# Patient Record
Sex: Female | Born: 1975 | Race: Black or African American | Hispanic: No | Marital: Married | Smoking: Never smoker
Health system: Southern US, Community
[De-identification: ages and names within clinical notes are randomized; demographics above are authoritative.]

## PROBLEM LIST (undated history)

## (undated) DIAGNOSIS — IMO0002 Reserved for concepts with insufficient information to code with codable children: Secondary | ICD-10-CM

## (undated) DIAGNOSIS — R87619 Unspecified abnormal cytological findings in specimens from cervix uteri: Secondary | ICD-10-CM

## (undated) HISTORY — PX: COLPOSCOPY: SHX161

## (undated) HISTORY — DX: Reserved for concepts with insufficient information to code with codable children: IMO0002

## (undated) HISTORY — DX: Unspecified abnormal cytological findings in specimens from cervix uteri: R87.619

---

## 2008-01-25 ENCOUNTER — Inpatient Hospital Stay (HOSPITAL_COMMUNITY): Admission: AD | Admit: 2008-01-25 | Discharge: 2008-01-25 | Payer: Self-pay | Admitting: Obstetrics and Gynecology

## 2008-05-27 ENCOUNTER — Inpatient Hospital Stay (HOSPITAL_COMMUNITY): Admission: AD | Admit: 2008-05-27 | Discharge: 2008-05-30 | Payer: Self-pay | Admitting: Obstetrics and Gynecology

## 2008-06-03 ENCOUNTER — Ambulatory Visit: Admission: RE | Admit: 2008-06-03 | Discharge: 2008-06-03 | Payer: Self-pay | Admitting: Obstetrics and Gynecology
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
## 2011-09-06 LAB — URINALYSIS, ROUTINE W REFLEX MICROSCOPIC
Bilirubin Urine: NEGATIVE
Ketones, ur: NEGATIVE
Nitrite: NEGATIVE
Protein, ur: 30 — AB
Urobilinogen, UA: 1

## 2011-09-06 LAB — URINE CULTURE: Colony Count: 1000

## 2011-09-12 LAB — CBC
HCT: 31.9 — ABNORMAL LOW
MCHC: 34.9
MCHC: 35.3
MCV: 94.8
Platelets: 170
Platelets: 188
RBC: 2.61 — ABNORMAL LOW
RDW: 12.5
RDW: 12.8
WBC: 17.1 — ABNORMAL HIGH
WBC: 9.3

## 2011-09-12 LAB — COMPREHENSIVE METABOLIC PANEL
ALT: 14
ALT: 15
AST: 24
AST: 27
Albumin: 1.9 — ABNORMAL LOW
CO2: 22
Calcium: 8.3 — ABNORMAL LOW
Chloride: 105
Creatinine, Ser: 0.68
GFR calc Af Amer: >60
GFR calc Af Amer: >60
GFR calc non Af Amer: >60
Glucose, Bld: 98
Sodium: 134 — ABNORMAL LOW
Sodium: 135
Total Bilirubin: 0.7
Total Protein: 5 — ABNORMAL LOW

## 2011-09-12 LAB — CREATININE CLEARANCE, URINE, 24 HOUR
Creatinine Clearance: 113
Creatinine, 24H Ur: 1102
Creatinine, Urine: 21.4
Creatinine: 0.68

## 2011-09-12 LAB — URINALYSIS, ROUTINE W REFLEX MICROSCOPIC
Hgb urine dipstick: NEGATIVE
Leukocytes, UA: NEGATIVE
Specific Gravity, Urine: 1.02
Urobilinogen, UA: 0.2

## 2011-09-12 LAB — PROTEIN, URINE, 24 HOUR: Urine Total Volume-UPROT: 5150

## 2011-09-12 LAB — URINE MICROSCOPIC-ADD ON

## 2011-09-12 LAB — LACTATE DEHYDROGENASE: LDH: 243

## 2011-09-12 LAB — URIC ACID: Uric Acid, Serum: 5.3

## 2011-12-17 NOTE — L&D Delivery Note (Signed)
Delivery Note At 1:33 AM a viable female was delivered via Vaginal, Spontaneous Delivery (Presentation: occiput anterior ;  ).  APGAR: 8, 9 ; weight .   Placenta status:intact 3VC , .  Cord:  with the following complications:None  .  Cord pH: NA  Anesthesia: Epidural  Episiotomy: none   Lacerations:none   Suture Repair: NA Est. Blood Loss (mL): 150 ml   Mom to postpartum.  Baby to nursery-stable.  Elsey Holts J. 08/01/2012, 1:48 AM

## 2011-12-25 LAB — OB RESULTS CONSOLE ABO/RH: RH Type: POSITIVE

## 2011-12-25 LAB — OB RESULTS CONSOLE HEPATITIS B SURFACE ANTIGEN: Hepatitis B Surface Ag: NEGATIVE

## 2011-12-25 LAB — OB RESULTS CONSOLE HIV ANTIBODY (ROUTINE TESTING): HIV: NONREACTIVE

## 2012-01-15 ENCOUNTER — Other Ambulatory Visit: Payer: Self-pay | Admitting: Obstetrics and Gynecology

## 2012-01-15 ENCOUNTER — Other Ambulatory Visit (HOSPITAL_COMMUNITY)
Admission: RE | Admit: 2012-01-15 | Discharge: 2012-01-15 | Disposition: A | Discharge status: Home / Self Care | Payer: PRIVATE HEALTH INSURANCE | Source: Ambulatory Visit | Attending: Obstetrics and Gynecology | Admitting: Obstetrics and Gynecology

## 2012-01-15 DIAGNOSIS — Z01419 Encounter for gynecological examination (general) (routine) without abnormal findings: Secondary | ICD-10-CM | POA: Insufficient documentation

## 2012-01-15 DIAGNOSIS — Z113 Encounter for screening for infections with a predominantly sexual mode of transmission: Secondary | ICD-10-CM | POA: Insufficient documentation

## 2012-01-15 LAB — OB RESULTS CONSOLE GC/CHLAMYDIA
Chlamydia: NEGATIVE
Gonorrhea: NEGATIVE

## 2012-07-09 LAB — OB RESULTS CONSOLE GBS: GBS: POSITIVE

## 2012-07-24 ENCOUNTER — Encounter (HOSPITAL_COMMUNITY): Payer: Self-pay | Admitting: *Deleted

## 2012-07-24 ENCOUNTER — Telehealth (HOSPITAL_COMMUNITY): Payer: Self-pay | Admitting: *Deleted

## 2012-07-24 NOTE — Telephone Encounter (Signed)
Preadmission screen  

## 2012-07-30 ENCOUNTER — Other Ambulatory Visit: Payer: Self-pay | Admitting: Obstetrics and Gynecology

## 2012-07-31 ENCOUNTER — Encounter (HOSPITAL_COMMUNITY): Payer: Self-pay

## 2012-07-31 ENCOUNTER — Encounter (HOSPITAL_COMMUNITY): Payer: Self-pay | Admitting: Anesthesiology

## 2012-07-31 ENCOUNTER — Inpatient Hospital Stay (HOSPITAL_COMMUNITY)
Admission: RE | Admit: 2012-07-31 | Discharge: 2012-08-03 | DRG: 775 | Disposition: A | Discharge status: Home / Self Care | Payer: Commercial Managed Care - PPO | Source: Ambulatory Visit | Attending: Obstetrics and Gynecology | Admitting: Obstetrics and Gynecology

## 2012-07-31 ENCOUNTER — Inpatient Hospital Stay (HOSPITAL_COMMUNITY): Payer: Commercial Managed Care - PPO | Admitting: Anesthesiology

## 2012-07-31 DIAGNOSIS — O99892 Other specified diseases and conditions complicating childbirth: Secondary | ICD-10-CM | POA: Diagnosis present

## 2012-07-31 DIAGNOSIS — Z22330 Carrier of Group B streptococcus: Secondary | ICD-10-CM

## 2012-07-31 DIAGNOSIS — O139 Gestational [pregnancy-induced] hypertension without significant proteinuria, unspecified trimester: Principal | ICD-10-CM | POA: Diagnosis present

## 2012-07-31 LAB — COMPREHENSIVE METABOLIC PANEL
ALT: 11 U/L (ref 0–35)
AST: 20 U/L (ref 0–37)
Albumin: 2.7 g/dL — ABNORMAL LOW (ref 3.5–5.2)
Alkaline Phosphatase: 179 U/L — ABNORMAL HIGH (ref 39–117)
BUN: 5 mg/dL — ABNORMAL LOW (ref 6–23)
CO2: 20 mEq/L (ref 19–32)
Calcium: 8.5 mg/dL (ref 8.4–10.5)
Chloride: 103 mEq/L (ref 96–112)
Creatinine, Ser: 0.61 mg/dL (ref 0.50–1.10)
GFR calc Af Amer: >90 mL/min (ref >90)
GFR calc non Af Amer: >90 mL/min (ref >90)
Glucose, Bld: 98 mg/dL (ref 70–99)
Potassium: 3.5 mEq/L (ref 3.5–5.1)
Sodium: 136 mEq/L (ref 135–145)
Total Bilirubin: 0.2 mg/dL — ABNORMAL LOW (ref 0.3–1.2)
Total Protein: 5.8 g/dL — ABNORMAL LOW (ref 6.0–8.3)

## 2012-07-31 LAB — CBC
HCT: 32.4 % — ABNORMAL LOW (ref 36.0–46.0)
Hemoglobin: 10.8 g/dL — ABNORMAL LOW (ref 12.0–15.0)
MCH: 30.3 pg (ref 26.0–34.0)
MCHC: 33.3 g/dL (ref 30.0–36.0)
MCV: 91 fL (ref 78.0–100.0)
Platelets: 152 10*3/uL (ref 150–400)
RBC: 3.56 MIL/uL — ABNORMAL LOW (ref 3.87–5.11)
RDW: 12.8 % (ref 11.5–15.5)
WBC: 8.9 10*3/uL (ref 4.0–10.5)

## 2012-07-31 LAB — ABO/RH: ABO/RH(D): O POS

## 2012-07-31 LAB — RPR: RPR Ser Ql: NONREACTIVE

## 2012-07-31 LAB — URIC ACID: Uric Acid, Serum: 5.2 mg/dL (ref 2.4–7.0)

## 2012-07-31 MED ORDER — PHENYLEPHRINE 40 MCG/ML (10ML) SYRINGE FOR IV PUSH (FOR BLOOD PRESSURE SUPPORT)
80.0000 ug | PREFILLED_SYRINGE | INTRAVENOUS | Status: DC | PRN
  Filled 2012-07-31: qty 5

## 2012-07-31 MED ORDER — LACTATED RINGERS IV SOLN
INTRAVENOUS | Status: DC
  Administered 2012-07-31 (×2): via INTRAVENOUS

## 2012-07-31 MED ORDER — OXYCODONE-ACETAMINOPHEN 5-325 MG PO TABS: 1.0000 | ORAL_TABLET | ORAL | Status: DC | PRN

## 2012-07-31 MED ORDER — BUTORPHANOL TARTRATE 1 MG/ML IJ SOLN: 1.0000 mg | INTRAMUSCULAR | Status: DC | PRN

## 2012-07-31 MED ORDER — LACTATED RINGERS IV SOLN
500.0000 mL | INTRAVENOUS | Status: DC | PRN
  Administered 2012-07-31: 1000 mL via INTRAVENOUS

## 2012-07-31 MED ORDER — LACTATED RINGERS IV SOLN: 500.0000 mL | Freq: Once | INTRAVENOUS | Status: DC

## 2012-07-31 MED ORDER — LIDOCAINE HCL (PF) 1 % IJ SOLN
INTRAMUSCULAR | Status: DC | PRN
  Administered 2012-07-31 (×2): 4 mL

## 2012-07-31 MED ORDER — OXYTOCIN BOLUS FROM INFUSION
250.0000 mL | Freq: Once | INTRAVENOUS | Status: DC
  Filled 2012-07-31: qty 500

## 2012-07-31 MED ORDER — EPHEDRINE 5 MG/ML INJ: 10.0000 mg | INTRAVENOUS | Status: DC | PRN

## 2012-07-31 MED ORDER — FENTANYL 2.5 MCG/ML BUPIVACAINE 1/10 % EPIDURAL INFUSION (WH - ANES)
INTRAMUSCULAR | Status: DC | PRN
  Administered 2012-07-31: 14 mL/h via EPIDURAL

## 2012-07-31 MED ORDER — OXYTOCIN 40 UNITS IN LACTATED RINGERS INFUSION - SIMPLE MED
62.5000 mL/h | Freq: Once | INTRAVENOUS | Status: AC
  Administered 2012-08-01: 62.5 mL/h via INTRAVENOUS

## 2012-07-31 MED ORDER — PHENYLEPHRINE 40 MCG/ML (10ML) SYRINGE FOR IV PUSH (FOR BLOOD PRESSURE SUPPORT): 80.0000 ug | PREFILLED_SYRINGE | INTRAVENOUS | Status: DC | PRN

## 2012-07-31 MED ORDER — PENICILLIN G POTASSIUM 5000000 UNITS IJ SOLR
5.0000 10*6.[IU] | Freq: Once | INTRAVENOUS | Status: AC
  Administered 2012-07-31: 5 10*6.[IU] via INTRAVENOUS
  Filled 2012-07-31: qty 5

## 2012-07-31 MED ORDER — FLEET ENEMA 7-19 GM/118ML RE ENEM: 1.0000 | ENEMA | RECTAL | Status: DC | PRN

## 2012-07-31 MED ORDER — FENTANYL 2.5 MCG/ML BUPIVACAINE 1/10 % EPIDURAL INFUSION (WH - ANES)
14.0000 mL/h | INTRAMUSCULAR | Status: DC
  Administered 2012-07-31 – 2012-08-01 (×3): 14 mL/h via EPIDURAL
  Filled 2012-07-31 (×4): qty 60

## 2012-07-31 MED ORDER — IBUPROFEN 600 MG PO TABS
600.0000 mg | ORAL_TABLET | Freq: Four times a day (QID) | ORAL | Status: DC | PRN
  Administered 2012-08-01: 600 mg via ORAL
  Filled 2012-07-31: qty 1

## 2012-07-31 MED ORDER — LACTATED RINGERS IV SOLN
INTRAVENOUS | Status: DC
  Administered 2012-07-31: 21:00:00 via INTRAUTERINE

## 2012-07-31 MED ORDER — LIDOCAINE HCL (PF) 1 % IJ SOLN: 30.0000 mL | INTRAMUSCULAR | Status: DC | PRN

## 2012-07-31 MED ORDER — CITRIC ACID-SODIUM CITRATE 334-500 MG/5ML PO SOLN: 30.0000 mL | ORAL | Status: DC | PRN

## 2012-07-31 MED ORDER — DIPHENHYDRAMINE HCL 50 MG/ML IJ SOLN: 12.5000 mg | INTRAMUSCULAR | Status: DC | PRN

## 2012-07-31 MED ORDER — ACETAMINOPHEN 325 MG PO TABS: 650.0000 mg | ORAL_TABLET | ORAL | Status: DC | PRN

## 2012-07-31 MED ORDER — PENICILLIN G POTASSIUM 5000000 UNITS IJ SOLR
2.5000 10*6.[IU] | INTRAVENOUS | Status: DC
  Administered 2012-07-31 (×4): 2.5 10*6.[IU] via INTRAVENOUS
  Filled 2012-07-31 (×8): qty 2.5

## 2012-07-31 MED ORDER — TERBUTALINE SULFATE 1 MG/ML IJ SOLN: 0.2500 mg | Freq: Once | INTRAMUSCULAR | Status: AC | PRN

## 2012-07-31 MED ORDER — EPHEDRINE 5 MG/ML INJ
10.0000 mg | INTRAVENOUS | Status: DC | PRN
  Filled 2012-07-31: qty 4

## 2012-07-31 MED ORDER — OXYTOCIN 40 UNITS IN LACTATED RINGERS INFUSION - SIMPLE MED
1.0000 m[IU]/min | INTRAVENOUS | Status: DC
  Administered 2012-07-31: 2 m[IU]/min via INTRAVENOUS
  Filled 2012-07-31: qty 1000

## 2012-07-31 MED ORDER — ONDANSETRON HCL 4 MG/2ML IJ SOLN
4.0000 mg | Freq: Four times a day (QID) | INTRAMUSCULAR | Status: DC | PRN
  Administered 2012-07-31: 4 mg via INTRAVENOUS
  Filled 2012-07-31: qty 2

## 2012-07-31 NOTE — Anesthesia Procedure Notes (Signed)
Epidural Patient location during procedure: OB Start time: 07/31/2012 2:28 PM  Staffing Anesthesiologist: Amor Hyle A. Performed by: anesthesiologist   Preanesthetic Checklist Completed: patient identified, site marked, surgical consent, pre-op evaluation, timeout performed, IV checked, risks and benefits discussed and monitors and equipment checked  Epidural Patient position: sitting Prep: site prepped and draped and DuraPrep Patient monitoring: continuous pulse ox and blood pressure Approach: midline Injection technique: LOR air  Needle:  Needle type: Tuohy  Needle gauge: 17 G Needle length: 9 cm Needle insertion depth: 5 cm cm Catheter type: closed end flexible Catheter size: 19 Gauge Catheter at skin depth: 10 cm Test dose: negative and Other  Assessment Events: blood not aspirated, injection not painful, no injection resistance, negative IV test and no paresthesia  Additional Notes Patient identified. Risks and benefits discussed including failed block, incomplete  Pain control, post dural puncture headache, nerve damage, paralysis, blood pressure Changes, nausea, vomiting, reactions to medications-both toxic and allergic and post Partum back pain. All questions were answered. Patient expressed understanding and wished to proceed. Sterile technique was used throughout procedure. Epidural site was Dressed with sterile barrier dressing. No paresthesias, signs of intravascular injection Or signs of intrathecal spread were encountered.  Patient was more comfortable after the epidural was dosed. Please see RN's note for documentation of vital signs and FHR which are stable.

## 2012-07-31 NOTE — Anesthesia Preprocedure Evaluation (Signed)
Anesthesia Evaluation  Patient identified by MRN, date of birth, ID band Patient awake    Reviewed: Allergy & Precautions, H&P , Patient's Chart, lab work & pertinent test results  Airway Mallampati: III TM Distance: >3 FB Neck ROM: full    Dental No notable dental hx. (+) Teeth Intact   Pulmonary neg pulmonary ROS,  breath sounds clear to auscultation  Pulmonary exam normal       Cardiovascular negative cardio ROS  Rhythm:regular Rate:Normal     Neuro/Psych negative neurological ROS  negative psych ROS   GI/Hepatic negative GI ROS, Neg liver ROS,   Endo/Other  negative endocrine ROS  Renal/GU negative Renal ROS  negative genitourinary   Musculoskeletal negative musculoskeletal ROS (+)   Abdominal Normal abdominal exam  (+)   Peds  Hematology negative hematology ROS (+)   Anesthesia Other Findings   Reproductive/Obstetrics (+) Pregnancy                           Anesthesia Physical Anesthesia Plan  ASA: II  Anesthesia Plan: Epidural   Post-op Pain Management:    Induction:   Airway Management Planned:   Additional Equipment:   Intra-op Plan:   Post-operative Plan:   Informed Consent: I have reviewed the patients History and Physical, chart, labs and discussed the procedure including the risks, benefits and alternatives for the proposed anesthesia with the patient or authorized representative who has indicated his/her understanding and acceptance.     Plan Discussed with: Anesthesiologist  Anesthesia Plan Comments:         Anesthesia Quick Evaluation

## 2012-07-31 NOTE — Progress Notes (Signed)
In to assess patient secondary to report of repetitive variable decels.. Pt's position has been changed and variables have continued.  Cx 7/80/0  IUPC placed  FHR baseline 120's good btbv +accels variable decels to 80's  Catergory II tracing  A/P 39 wks 5 days with category II tracing.  Start amnioinfusion , O2 applied , 500 cc bolus of IV fluid  D/C pitocin.. Once fetal heart rate is reassuring... Restart pitocin.  Will monitor closed.

## 2012-07-31 NOTE — H&P (Signed)
Amber Arnold is a 36 y.o. female presenting for induction at 39 wks and 5 days. She desires a social induction. She lives in [place] . +FM  No lof no vaginal bleeding Irregular contractions.  Maternal Medical History:  Contractions: Onset was 1 week ago.   Frequency: irregular.   Perceived severity is moderate.    Fetal activity: Perceived fetal activity is normal.   Last perceived fetal movement was within the past hour.    Prenatal complications: no prenatal complications   OB History    Grav Para Term Preterm Abortions TAB SAB Ect Mult Living   5 1 1  0 3 1 2  0 0 1     Past Medical History  Diagnosis Date  . Abnormal Pap smear    Past Surgical History  Procedure Date  . Colposcopy    Family History: family history includes Hydrocephalus in her brother. Social History:  reports that she has never smoked. She does not have any smokeless tobacco history on file. She reports that she does not drink alcohol or use illicit drugs.   Prenatal Transfer Tool  Maternal Diabetes: No Genetic Screening: Normal Maternal Ultrasounds/Referrals: Normal Fetal Ultrasounds or other Referrals:  None Maternal Substance Abuse:  No Significant Maternal Medications:  Meds include: Other: see prenatal record penicillin during labor Significant Maternal Lab Results:  Lab values include: Group B Strep positive Other Comments:  None  Review of Systems  All other systems reviewed and are negative.    Dilation: 2 Effacement (%): 70 Station: -2 Exam by:: Amber Arnold Blood pressure 144/96, pulse 61, temperature 98.1 F (36.7 C), resp. rate 16, height 5\' 3"  (1.6 m), weight 89.812 kg (198 lb), last menstrual period 10/27/2011. Maternal Exam:  Uterine Assessment: Contraction strength is moderate.  Contraction duration is 1 minute. Contraction frequency is regular.   Abdomen: Patient reports no abdominal tenderness. Estimated fetal weight is 8 lbs .   Fetal presentation: vertex  Introitus:  Normal vulva. Normal vagina.  Amniotic fluid character: clear.  Pelvis: adequate for delivery.   Cervix: Cervix evaluated by digital exam.     Fetal Exam Fetal Monitor Review: Mode: hand-held doppler probe.   Baseline rate: 140's.  Variability: moderate (6-25 bpm).    Fetal State Assessment: Category I - tracings are normal.     Physical Exam  Constitutional: She is oriented to person, place, and time. She appears well-developed.  Neck: Normal range of motion.  Cardiovascular: Normal rate.   Respiratory: Effort normal. No respiratory distress. She has no wheezes. She has no rales. She exhibits no tenderness.  Genitourinary: Vagina normal.  Musculoskeletal: She exhibits edema.  Neurological: She is alert and oriented to person, place, and time.  Skin: Skin is warm and dry.  Psychiatric: She has a normal mood and affect.    Cx  2/75/-1.Marland Kitchen Arom Clear fluid IUPC placed  Prenatal labs: ABO, Rh: --/--/O POS, O POS (08/16 0750) Antibody: NEG (08/16 0750) Rubella: Immune (01/09 0000) RPR: Nonreactive (01/09 0000)  HBsAg: Negative (01/09 0000)  HIV: Non-reactive (01/09 0000)  GBS: Positive (07/25 0000)   Assessment/Plan: 39 wks 5 days for social induction. I have discussed with the patient increased r/o cesarean section associated with induction. She voiced understandign and desires to proceed with induction.  Continue pitocin  Anticipate svd.   Penicillin for GBS Postive status    Amber Arnold J. 07/31/2012, 1:51 PM

## 2012-07-31 NOTE — Progress Notes (Signed)
In to assess patient  She is comfortable with her epidural .  FHR baseline 140's good btbv + accels  Occasional variable decel . Category 1 tracing  Cx 4/80/-2  Toco ctx q 3 minutes mvu's 160 on 20 units of pitocin  A/P 39 wks and 5 days for induction.. Continue pitocin..  Anticipate svd  Continue penicillin

## 2012-08-01 ENCOUNTER — Encounter (HOSPITAL_COMMUNITY): Payer: Self-pay

## 2012-08-01 LAB — CBC
HCT: 32.1 % — ABNORMAL LOW (ref 36.0–46.0)
MCH: 30.4 pg (ref 26.0–34.0)
MCHC: 33.3 g/dL (ref 30.0–36.0)
MCV: 91.2 fL (ref 78.0–100.0)
Platelets: 152 10*3/uL (ref 150–400)
RDW: 12.9 % (ref 11.5–15.5)
WBC: 17.8 10*3/uL — ABNORMAL HIGH (ref 4.0–10.5)

## 2012-08-01 LAB — COMPREHENSIVE METABOLIC PANEL
Alkaline Phosphatase: 169 U/L — ABNORMAL HIGH (ref 39–117)
BUN: 4 mg/dL — ABNORMAL LOW (ref 6–23)
CO2: 21 mEq/L (ref 19–32)
GFR calc Af Amer: >90 mL/min (ref >90)
GFR calc non Af Amer: >90 mL/min (ref >90)
Glucose, Bld: 104 mg/dL — ABNORMAL HIGH (ref 70–99)
Potassium: 4 mEq/L (ref 3.5–5.1)
Total Protein: 5.4 g/dL — ABNORMAL LOW (ref 6.0–8.3)

## 2012-08-01 MED ORDER — SIMETHICONE 80 MG PO CHEW: 80.0000 mg | CHEWABLE_TABLET | ORAL | Status: DC | PRN

## 2012-08-01 MED ORDER — WITCH HAZEL-GLYCERIN EX PADS: 1.0000 "application " | MEDICATED_PAD | CUTANEOUS | Status: DC | PRN

## 2012-08-01 MED ORDER — ZOLPIDEM TARTRATE 5 MG PO TABS: 5.0000 mg | ORAL_TABLET | Freq: Every evening | ORAL | Status: DC | PRN

## 2012-08-01 MED ORDER — LANOLIN HYDROUS EX OINT: TOPICAL_OINTMENT | CUTANEOUS | Status: DC | PRN

## 2012-08-01 MED ORDER — IBUPROFEN 600 MG PO TABS
600.0000 mg | ORAL_TABLET | Freq: Four times a day (QID) | ORAL | Status: DC
  Administered 2012-08-01 – 2012-08-03 (×9): 600 mg via ORAL
  Filled 2012-08-01 (×7): qty 1

## 2012-08-01 MED ORDER — DIPHENHYDRAMINE HCL 25 MG PO CAPS: 25.0000 mg | ORAL_CAPSULE | Freq: Four times a day (QID) | ORAL | Status: DC | PRN

## 2012-08-01 MED ORDER — SENNOSIDES-DOCUSATE SODIUM 8.6-50 MG PO TABS
2.0000 | ORAL_TABLET | Freq: Every day | ORAL | Status: DC
  Administered 2012-08-01 – 2012-08-02 (×2): 2 via ORAL

## 2012-08-01 MED ORDER — BENZOCAINE-MENTHOL 20-0.5 % EX AERO: 1.0000 "application " | INHALATION_SPRAY | CUTANEOUS | Status: DC | PRN

## 2012-08-01 MED ORDER — OXYCODONE-ACETAMINOPHEN 5-325 MG PO TABS
1.0000 | ORAL_TABLET | ORAL | Status: DC | PRN
  Administered 2012-08-01 (×2): 2 via ORAL
  Administered 2012-08-01: 1 via ORAL
  Administered 2012-08-01 – 2012-08-02 (×3): 2 via ORAL
  Administered 2012-08-02: 1 via ORAL
  Filled 2012-08-01: qty 2
  Filled 2012-08-01: qty 1
  Filled 2012-08-01 (×4): qty 2
  Filled 2012-08-01: qty 1
  Filled 2012-08-01: qty 2

## 2012-08-01 MED ORDER — ONDANSETRON HCL 4 MG/2ML IJ SOLN: 4.0000 mg | INTRAMUSCULAR | Status: DC | PRN

## 2012-08-01 MED ORDER — PRENATAL MULTIVITAMIN CH
1.0000 | ORAL_TABLET | Freq: Every day | ORAL | Status: DC
  Administered 2012-08-02: 1 via ORAL
  Filled 2012-08-01 (×2): qty 1

## 2012-08-01 MED ORDER — DIBUCAINE 1 % RE OINT: 1.0000 "application " | TOPICAL_OINTMENT | RECTAL | Status: DC | PRN

## 2012-08-01 MED ORDER — TETANUS-DIPHTH-ACELL PERTUSSIS 5-2.5-18.5 LF-MCG/0.5 IM SUSP
0.5000 mL | Freq: Once | INTRAMUSCULAR | Status: AC
  Administered 2012-08-03: 0.5 mL via INTRAMUSCULAR
  Filled 2012-08-01: qty 0.5

## 2012-08-01 MED ORDER — ONDANSETRON HCL 4 MG PO TABS: 4.0000 mg | ORAL_TABLET | ORAL | Status: DC | PRN

## 2012-08-01 NOTE — Anesthesia Postprocedure Evaluation (Signed)
  Anesthesia Post-op Note  Patient: Amber Arnold  Procedure(s) Performed: * No procedures listed *  Patient Location: Mother/Baby  Anesthesia Type: Epidural  Level of Consciousness: awake, alert  and oriented  Airway and Oxygen Therapy: Patient Spontanous Breathing  Post-op Pain: mild  Post-op Assessment: Patient's Cardiovascular Status Stable, Respiratory Function Stable, Patent Airway, No signs of Nausea or vomiting and Pain level controlled  Post-op Vital Signs: stable  Complications: No apparent anesthesia complications

## 2012-08-01 NOTE — Progress Notes (Signed)
Post Partum Day 0 s/p vaginal delivery  Subjective: no complaints, up ad lib, voiding and tolerating PO  Objective: Blood pressure 138/73, pulse 60, temperature 98.2 F (36.8 C), temperature source Oral, resp. rate 18, height 5\' 3"  (1.6 m), weight 89.812 kg (198 lb), last menstrual period 10/27/2011, SpO2 97.00%, unknown if currently breastfeeding.  Physical Exam:  General: alert and cooperative Lochia: appropriate Uterine Fundus: firm Incision: NA DVT Evaluation: No evidence of DVT seen on physical exam.   Basename 08/01/12 0555 07/31/12 0750  HGB 10.7* 10.8*  HCT 32.1* 32.4*    Assessment/Plan: Breastfeeding and Circumcision prior to discharge I discussed with the patient r/b/a of circumcision. That it is not medically indicated. Risk include but are not limited to infection bleeding damage to penis and poor cosmesis with the need for further surgery. Pt voiced understanding and desires to have circumcision performed.    LOS: 1 day   Masao Junker J. 08/01/2012, 1:21 PM

## 2012-08-02 NOTE — Progress Notes (Signed)
Post Partum Day 1 s/p vaginal delivery  Subjective: up ad lib, voiding and tolerating PO reports back pain  Denies headache / blurred vision / ruq pain   Objective: Blood pressure 129/87, pulse 71, temperature 98.3 F (36.8 C), temperature source Oral, resp. rate 20, height 5\' 3"  (1.6 m), weight 89.812 kg (198 lb), last menstrual period 10/27/2011, SpO2 97.00%, unknown if currently breastfeeding.  Physical Exam:  General: alert and cooperative Lochia: appropriate Uterine Fundus: firm Incision: NA DVT Evaluation: No evidence of DVT seen on physical exam.   Basename 08/01/12 0555 07/31/12 0750  HGB 10.7* 10.8*  HCT 32.1* 32.4*    Assessment/Plan: Plan for discharge tomorrow and Breastfeeding   LOS: 2 days   Kamorah Nevils J. 08/02/2012, 8:46 AM

## 2012-08-03 DIAGNOSIS — O139 Gestational [pregnancy-induced] hypertension without significant proteinuria, unspecified trimester: Secondary | ICD-10-CM | POA: Diagnosis present

## 2012-08-03 MED ORDER — IBUPROFEN 600 MG PO TABS: 600.0000 mg | ORAL_TABLET | Freq: Four times a day (QID) | ORAL | Status: AC | PRN

## 2012-08-03 MED ORDER — NIFEDIPINE ER 30 MG PO TB24
30.0000 mg | ORAL_TABLET | Freq: Every day | ORAL | Status: DC
  Administered 2012-08-03 (×2): 30 mg via ORAL
  Filled 2012-08-03 (×3): qty 1

## 2012-08-03 MED ORDER — NIFEDIPINE ER 30 MG PO TB24: 30.0000 mg | ORAL_TABLET | Freq: Every day | ORAL | Status: AC

## 2012-08-03 NOTE — Discharge Summary (Signed)
Obstetric Discharge Summary Reason for Admission: induction of labor Prenatal Procedures: none Intrapartum Procedures: spontaneous vaginal delivery Postpartum Procedures: none Complications-Operative and Postpartum: gestational hypertension Hemoglobin  Date Value Range Status  08/01/2012 10.7* 12.0 - 15.0 g/dL Final     HCT  Date Value Range Status  08/01/2012 32.1* 36.0 - 46.0 % Final    Physical Exam:  General: alert and cooperative Lochia: appropriate Uterine Fundus: firm Incision: NA DVT Evaluation: Calf/Ankle edema is present.  Discharge Diagnoses: Term Pregnancy-delivered and gestational hypertension  Discharge Information: Date: 08/03/2012 Activity: pelvic rest Diet: routine Medications: PNV, Ibuprofen and procardia xl 30 mg daily  Condition: stable Instructions: refer to practice specific booklet Discharge to: home Follow-up Information    Follow up with Jessee Avers., MD. Schedule an appointment as soon as possible for a visit in 1 week. (please call the office and make an appt in 1 wk for a nurse visit for blood pressure check )    Contact information:   [address] [place] [address] [phone]          Newborn Data: Live born female  Birth Weight: 8 lb 4.6 oz (3759 g) APGAR: 8, 9  Home with mother.  River Ambrosio J. 08/03/2012, 9:16 AM

## 2012-08-03 NOTE — Progress Notes (Signed)
Post Partum Day 2 s/p vaginal delivery  Subjective: no complaints, up ad lib, voiding and tolerating PO she denies headache blurred vision or ruq pain   Objective: Blood pressure 140/86, pulse 73, temperature 98.4 F (36.9 C), temperature source Oral, resp. rate 20, height 5\' 3"  (1.6 m), weight 89.812 kg (198 lb), last menstrual period 10/27/2011, SpO2 97.00%, unknown if currently breastfeeding.  Physical Exam:  General: alert and cooperative Lochia: appropriate Uterine Fundus: firm Incision: NA DVT Evaluation: 2+ edema    Basename 08/01/12 0555  HGB 10.7*  HCT 32.1*    Assessment/Plan: Discharge home and Breastfeeding Gestational hypertension.. Will add procardia xl 30 mg pt to come to the office in 1 wk for bp check    LOS: 3 days   Keveon Amsler J. 08/03/2012, 9:10 AM

## 2012-08-03 NOTE — Progress Notes (Signed)
Patient called out and asked for someone to take her blood pressure.  Felt like her "heart is racing".  Her pulse is 123, blood pressure is 137/80.  No other S/S.  Patient advised to lay down and call RN if she started to feel any other S/S.  Patient stated she will try to rest prior to going home.  Will re-check BP and pulse prior to discharging.

## 2012-08-04 NOTE — Progress Notes (Signed)
Post discharge chart review completed.  

## 2014-10-17 ENCOUNTER — Encounter (HOSPITAL_COMMUNITY): Payer: Self-pay

## 2018-09-01 ENCOUNTER — Other Ambulatory Visit: Payer: Self-pay | Admitting: Obstetrics and Gynecology

## 2018-09-01 ENCOUNTER — Other Ambulatory Visit (HOSPITAL_COMMUNITY)
Admission: RE | Admit: 2018-09-01 | Discharge: 2018-09-01 | Disposition: A | Discharge status: Home / Self Care | Payer: No Typology Code available for payment source | Source: Ambulatory Visit | Attending: Obstetrics and Gynecology | Admitting: Obstetrics and Gynecology

## 2018-09-01 DIAGNOSIS — Z01411 Encounter for gynecological examination (general) (routine) with abnormal findings: Secondary | ICD-10-CM | POA: Diagnosis not present

## 2018-09-08 LAB — CYTOLOGY - PAP
Diagnosis: NEGATIVE
HPV: NOT DETECTED

## 2018-09-25 ENCOUNTER — Other Ambulatory Visit: Payer: Self-pay | Admitting: Obstetrics and Gynecology

## 2018-09-25 DIAGNOSIS — Z1231 Encounter for screening mammogram for malignant neoplasm of breast: Secondary | ICD-10-CM

## 2018-10-06 ENCOUNTER — Ambulatory Visit
Admission: RE | Admit: 2018-10-06 | Discharge: 2018-10-06 | Disposition: A | Discharge status: Home / Self Care | Payer: No Typology Code available for payment source | Source: Ambulatory Visit | Attending: Obstetrics and Gynecology | Admitting: Obstetrics and Gynecology

## 2018-10-06 ENCOUNTER — Encounter: Payer: Self-pay | Admitting: Radiology

## 2018-10-06 DIAGNOSIS — Z1231 Encounter for screening mammogram for malignant neoplasm of breast: Secondary | ICD-10-CM

## 2019-10-22 ENCOUNTER — Other Ambulatory Visit: Payer: Self-pay | Admitting: Obstetrics and Gynecology

## 2019-10-22 DIAGNOSIS — Z1231 Encounter for screening mammogram for malignant neoplasm of breast: Secondary | ICD-10-CM

## 2019-10-27 ENCOUNTER — Ambulatory Visit
Admission: RE | Admit: 2019-10-27 | Discharge: 2019-10-27 | Disposition: A | Discharge status: Home / Self Care | Payer: No Typology Code available for payment source | Source: Ambulatory Visit | Attending: Obstetrics and Gynecology | Admitting: Obstetrics and Gynecology

## 2019-10-27 ENCOUNTER — Other Ambulatory Visit: Payer: Self-pay

## 2019-10-27 DIAGNOSIS — Z1231 Encounter for screening mammogram for malignant neoplasm of breast: Secondary | ICD-10-CM

## 2020-05-14 IMAGING — MG DIGITAL SCREENING BILAT W/ TOMO W/ CAD
8 series · 8 of 24 positions shown · non-contrast
Comparison: Previous exam(s).

CLINICAL DATA: Screening.

EXAM:
DIGITAL SCREENING BILATERAL MAMMOGRAM WITH TOMO AND CAD

[L CC synth-2D]
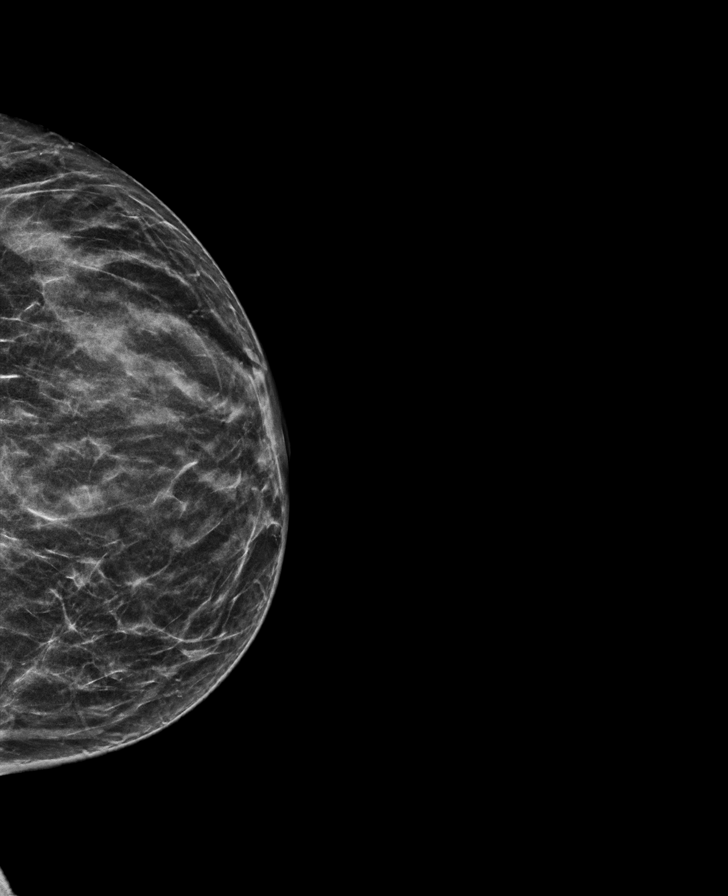

[R CC synth-2D]
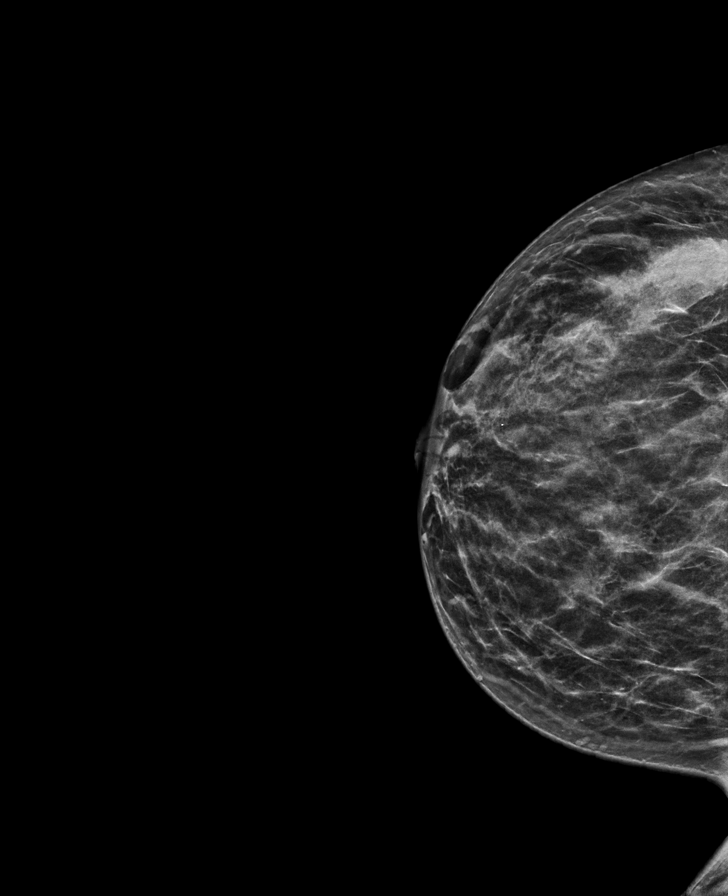

[R MLO synth-2D]
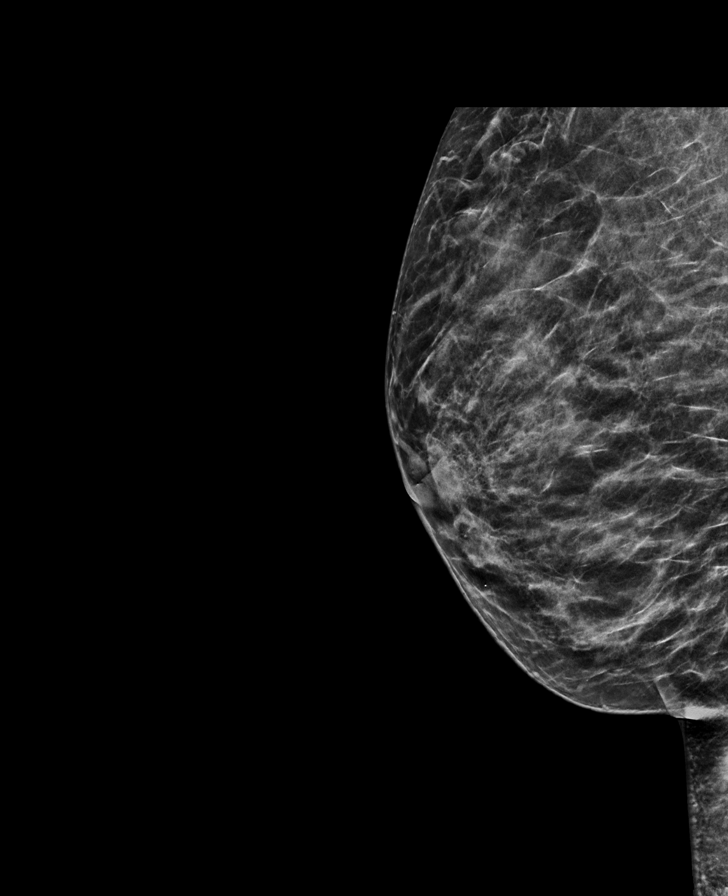

[L MLO synth-2D]
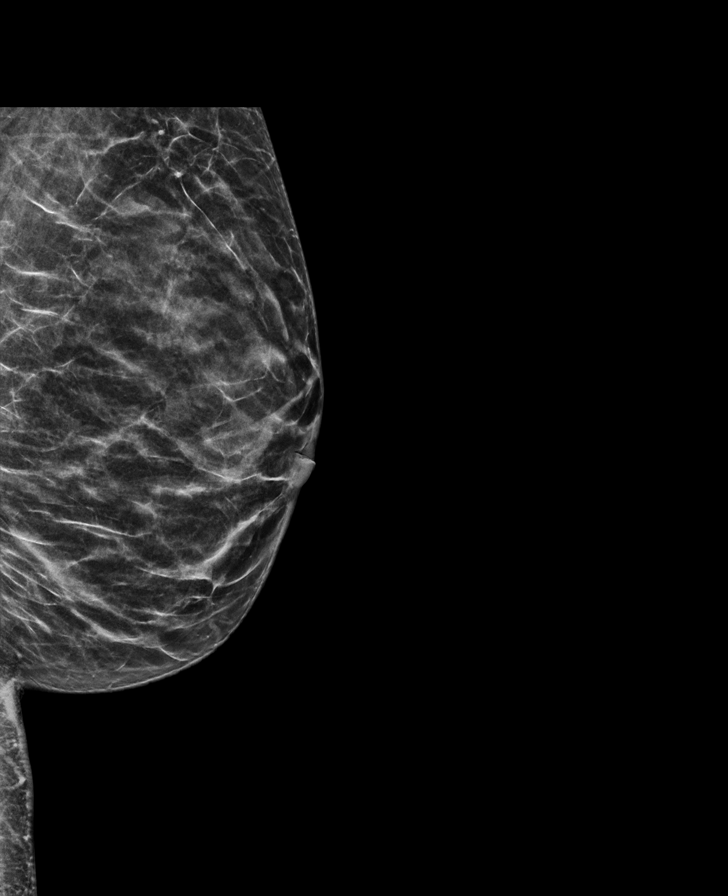

[R MLO tomo · tomo slice 28/55.0]
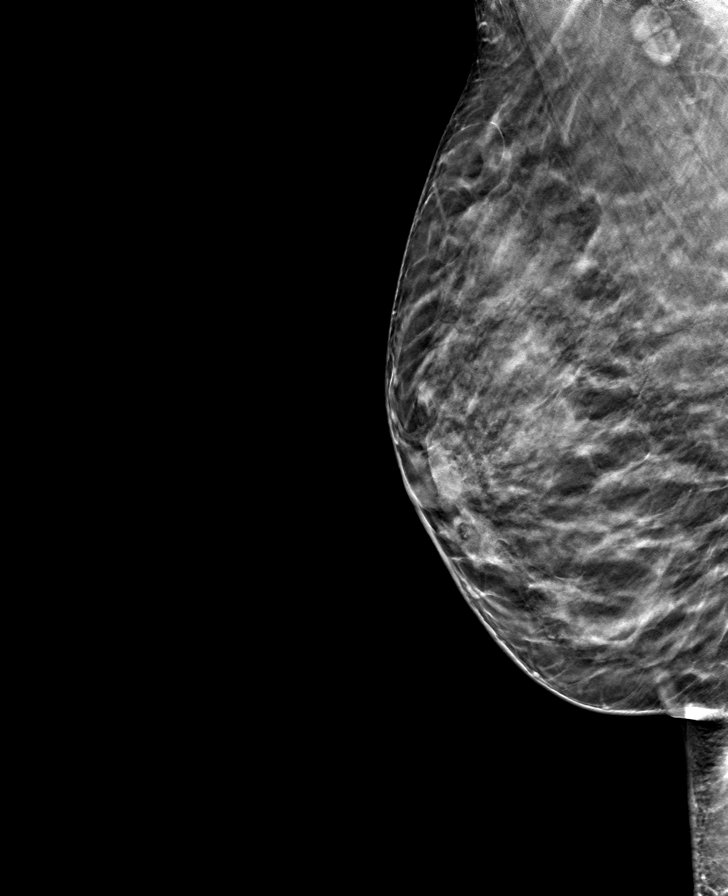

[L MLO tomo · tomo slice 27/54.0]
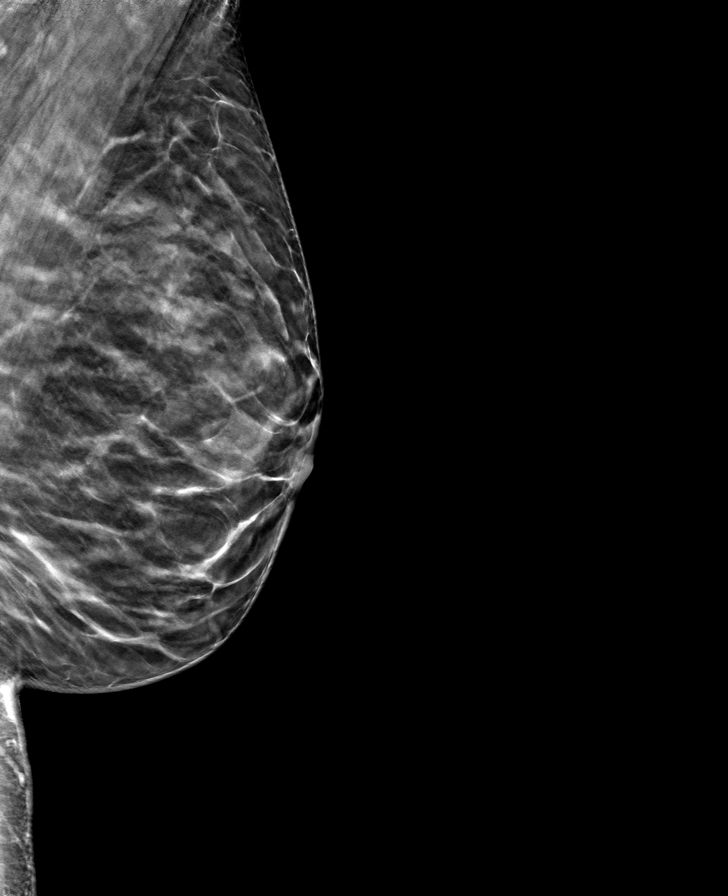

[R CC tomo · tomo slice 27/54.0]
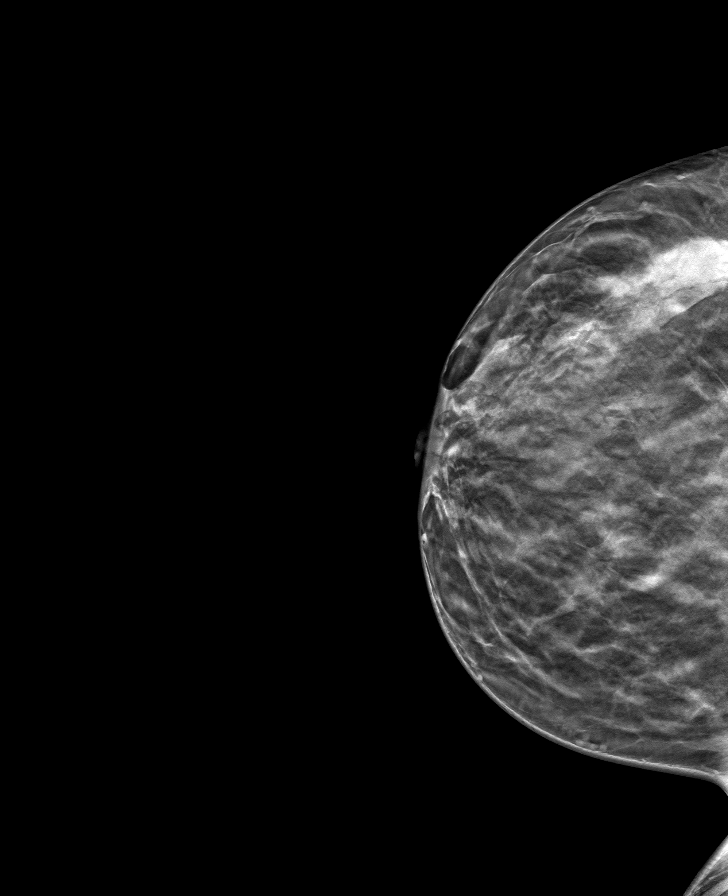

[L CC tomo · tomo slice 27/53.0]
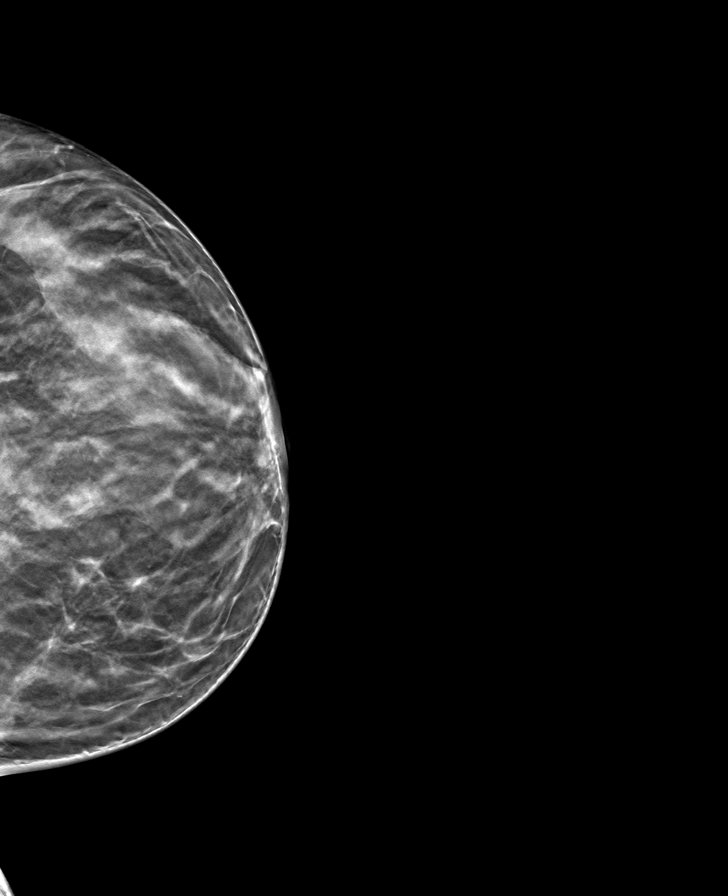

[8 of 24 positions shown; findings below may reference images not displayed]

ACR Breast Density Category c: The breast tissue is heterogeneously
dense, which may obscure small masses.
FINDINGS: There are no findings suspicious for malignancy. Images were
processed with CAD.
IMPRESSION: No mammographic evidence of malignancy. A result letter of this
screening mammogram will be mailed directly to the patient.

RECOMMENDATION:
Screening mammogram in one year. (Code:FT-U-LHB)

BI-RADS CATEGORY  1: Negative.

## 2021-08-12 ENCOUNTER — Ambulatory Visit (INDEPENDENT_AMBULATORY_CARE_PROVIDER_SITE_OTHER): Payer: 59

## 2021-08-12 ENCOUNTER — Other Ambulatory Visit: Payer: Self-pay

## 2021-08-12 ENCOUNTER — Ambulatory Visit (HOSPITAL_COMMUNITY)
Admission: EM | Admit: 2021-08-12 | Discharge: 2021-08-12 | Disposition: A | Discharge status: Home / Self Care | Payer: 59 | Attending: Physician Assistant | Admitting: Physician Assistant

## 2021-08-12 ENCOUNTER — Encounter (HOSPITAL_COMMUNITY): Payer: Self-pay | Admitting: *Deleted

## 2021-08-12 DIAGNOSIS — S99922A Unspecified injury of left foot, initial encounter: Secondary | ICD-10-CM

## 2021-08-12 DIAGNOSIS — M79675 Pain in left toe(s): Secondary | ICD-10-CM

## 2021-08-12 DIAGNOSIS — M79672 Pain in left foot: Secondary | ICD-10-CM | POA: Diagnosis not present

## 2021-08-12 DIAGNOSIS — W19XXXA Unspecified fall, initial encounter: Secondary | ICD-10-CM

## 2021-08-12 MED ORDER — NAPROXEN 500 MG PO TABS: 500.0000 mg | ORAL_TABLET | Freq: Two times a day (BID) | ORAL | 0 refills | Status: AC

## 2021-08-12 NOTE — Discharge Instructions (Addendum)
Your x-ray was normal.  I believe that you have a bone bruise causing your symptoms.  Please use postop shoe to provide comfort when you are walking around.  Keep your leg elevated and use ice for additional symptom relief.  Start Naprosyn 500 mg twice daily.  You should not take additional NSAIDs including aspirin, ibuprofen/Advil, naproxen/Aleve with this medication as it can cause stomach bleeding.  It can raise your blood pressure so please monitor your blood pressure while on this medication.  You can use Tylenol for breakthrough pain.  If you develop any worsening symptoms please return for reevaluation.

## 2021-08-12 NOTE — ED Triage Notes (Signed)
Pt fell Friday while shooting ball with her son. Pt reported he leg gave out.

## 2021-08-12 NOTE — ED Provider Notes (Signed)
[facility]    CSN: [id number] Arrival date & time: 08/12/21  1014      History   Chief Complaint Chief Complaint  Patient presents with   Foot Injury    HPI Amber Arnold is a 45 y.o. female.   Patient presents today with a several day history of left foot pain following injury.  Reports that she was sitting basketball with her son when her left leg gave out causing her to fall onto the ground.  The majority of her weight landed on her knee and foot causing her left shoe to rip.  She is confident that she did not hit her head and denies any loss of consciousness, nausea, vomiting, headache, dizziness, amnesia surrounding event.  She reports several skin abrasions but these have gradually been improving.  She did initially have some left knee pain but this is improving and she is able to bear weight without instability.  Her primary concern today is left foot pain.  Reports pain is minimal at rest but increases to 6 with attempted ambulation or palpation, localized to left great MTP with radiation along metatarsal, described as aching with periodic shooting pains, worse with ambulation, no alleviating factors identified.  She has tried over-the-counter analgesics without improvement of symptoms.  She is prescribed gabapentin and Robaxin for chronic back pain which she has been taking without improvement of the symptoms.  She reports symptoms have gradually improved but she is continuing to have difficulty ambulating prompting evaluation today.   Past Medical History:  Diagnosis Date   Abnormal Pap smear     Patient Active Problem List   Diagnosis Date Noted   Gestational hypertension 08/03/2012    Past Surgical History:  Procedure Laterality Date   COLPOSCOPY      OB History     Gravida  5   Para  2   Term  2   Preterm  0   AB  3   Living  2      SAB  2   IAB  1   Ectopic  0   Multiple  0   Live Births  2            Home Medications     Prior to Admission medications   Medication Sig Start Date End Date Taking? Authorizing Provider  naproxen (NAPROSYN) 500 MG tablet Take 1 tablet (500 mg total) by mouth 2 (two) times daily. 08/12/21  Yes Brison Fiumara, Noberto Retort, PA-C  acetaminophen (TYLENOL) 325 MG tablet Take 650 mg by mouth every 6 (six) hours as needed. For back pain.    [provider]  NIFEdipine (PROCARDIA-XL/ADALAT CC) 30 MG 24 hr tablet Take 1 tablet (30 mg total) by mouth daily. 08/03/12 08/03/13  Gerald Leitz, MD  Prenatal Vit-Fe Fumarate-FA (PRENATAL MULTIVITAMIN) TABS Take 1 tablet by mouth daily.    [provider]    Family History Family History  Problem Relation Age of Onset   Hydrocephalus Brother     Social History Social History   Tobacco Use   Smoking status: Never   Smokeless tobacco: Never  Substance Use Topics   Alcohol use: No   Drug use: No     Allergies   Pineapple   Review of Systems Review of Systems  Constitutional:  Positive for activity change. Negative for appetite change, fatigue and fever.  Respiratory:  Negative for cough and shortness of breath.   Cardiovascular:  Negative for chest pain.  Gastrointestinal:  Negative for abdominal pain, diarrhea, nausea and vomiting.  Musculoskeletal:  Positive for arthralgias, gait problem and joint swelling. Negative for myalgias.  Neurological:  Negative for dizziness, light-headedness and headaches.    Physical Exam Triage Vital Signs ED Triage Vitals  Enc Vitals Group     BP 08/12/21 1120 (!) 139/95     Pulse Rate 08/12/21 1120 75     Resp 08/12/21 1120 18     Temp 08/12/21 1120 98.4 F (36.9 C)     Temp src --      SpO2 08/12/21 1120 100 %     Weight --      Height --      Head Circumference --      Peak Flow --      Pain Score 08/12/21 1116 6     Pain Loc --      Pain Edu? --      Excl. in GC? --    No data found.  Updated Vital Signs BP (!) 139/95   Pulse 75   Temp 98.4 F (36.9 C)   Resp 18    SpO2 100%   Visual Acuity Right Eye Distance:   Left Eye Distance:   Bilateral Distance:    Right Eye Near:   Left Eye Near:    Bilateral Near:     Physical Exam Vitals reviewed.  Constitutional:      General: She is awake. She is not in acute distress.    Appearance: Normal appearance. She is normal weight. She is not ill-appearing.     Comments: Very pleasant female appears at age no acute distress sitting comfortably in exam room  HENT:     Head: Normocephalic and atraumatic.  Cardiovascular:     Rate and Rhythm: Normal rate and regular rhythm.     Pulses:          Posterior tibial pulses are 2+ on the right side and 2+ on the left side.     Heart sounds: Normal heart sounds, S1 normal and S2 normal. No murmur heard.    Comments: Unable to assess capillary refill in toes due to nail polish; strong pulses. Pulmonary:     Effort: Pulmonary effort is normal.     Breath sounds: Normal breath sounds. No wheezing, rhonchi or rales.     Comments: Clear to auscultation bilaterally Musculoskeletal:     Left knee: Laceration present. No swelling or bony tenderness. Normal range of motion. Tenderness present. No LCL laxity, MCL laxity, ACL laxity or PCL laxity.    Right lower leg: No edema.     Left lower leg: No edema.     Comments: Left knee: Skin abrasion noted over patella.  Normal active range of motion with flexion and extension.  Strength 5/5 bilateral lower extremities.  No ligamentous laxity on exam.  Patient is able to bear weight; normal gait.  Left foot: Foot neurovascularly intact.  Bruising and swelling noted at left great toe MTP.  Tenderness palpation along first metatarsal without deformity.  Psychiatric:        Behavior: Behavior is cooperative.     UC Treatments / Results  Labs (all labs ordered are listed, but only abnormal results are displayed) Labs Reviewed - No data to display  EKG   Radiology DG Foot Complete Left  Result Date: 08/12/2021 CLINICAL  DATA:  Left foot pain after fall 2 days ago. EXAM: LEFT FOOT - COMPLETE 3+ VIEW COMPARISON:  None. FINDINGS: There is no  evidence of fracture or dislocation. Mild hallux valgus deformity of first metatarsophalangeal joint is noted. Soft tissues are unremarkable. IMPRESSION: Mild hallux valgus deformity of first metatarsophalangeal joint. No acute abnormality is noted. Electronically Signed   By: Lupita Raider M.D.   On: 08/12/2021 12:34    Procedures Procedures (including critical care time)  Medications Ordered in UC Medications - No data to display  Initial Impression / Assessment and Plan / UC Course  I have reviewed the triage vital signs and the nursing notes.  Pertinent labs & imaging results that were available during my care of the patient were reviewed by me and considered in my medical decision making (see chart for details).      X-ray obtained of foot given bony tenderness showed no acute abnormality.  Discussed that contusion is likely etiology of symptoms.  She was instructed to use conservative treatment measures including RICE protocol to manage symptoms.  She was placed in a postop shoe for comfort.  She was prescribed NSAIDs (Naprosyn 500 mg twice daily) with instruction not to take additional NSAIDs due to risk of GI bleeding.  She can use Tylenol for breakthrough pain.  Discussed alarm symptoms that warrant emergent evaluation.  Return precautions given to which patient expressed understanding.  Final Clinical Impressions(s) / UC Diagnoses   Final diagnoses:  Fall, initial encounter  Great toe pain, left  Injury of left foot, initial encounter     Discharge Instructions      Your x-ray was normal.  I believe that you have a bone bruise causing your symptoms.  Please use postop shoe to provide comfort when you are walking around.  Keep your leg elevated and use ice for additional symptom relief.  Start Naprosyn 500 mg twice daily.  You should not take additional  NSAIDs including aspirin, ibuprofen/Advil, naproxen/Aleve with this medication as it can cause stomach bleeding.  It can raise your blood pressure so please monitor your blood pressure while on this medication.  You can use Tylenol for breakthrough pain.  If you develop any worsening symptoms please return for reevaluation.     ED Prescriptions     Medication Sig Dispense Auth. Provider   naproxen (NAPROSYN) 500 MG tablet Take 1 tablet (500 mg total) by mouth 2 (two) times daily. 30 tablet Blain Hunsucker, Noberto Retort, PA-C      PDMP not reviewed this encounter.   Jeani Hawking, PA-C 08/12/21 1310

## 2022-02-28 IMAGING — DX DG FOOT COMPLETE 3+V*L*
3 series · 3 of 3 positions shown · non-contrast
Comparison: None.

CLINICAL DATA: Left foot pain after fall 2 days ago.

EXAM:
LEFT FOOT - COMPLETE 3+ VIEW

[foot ap]
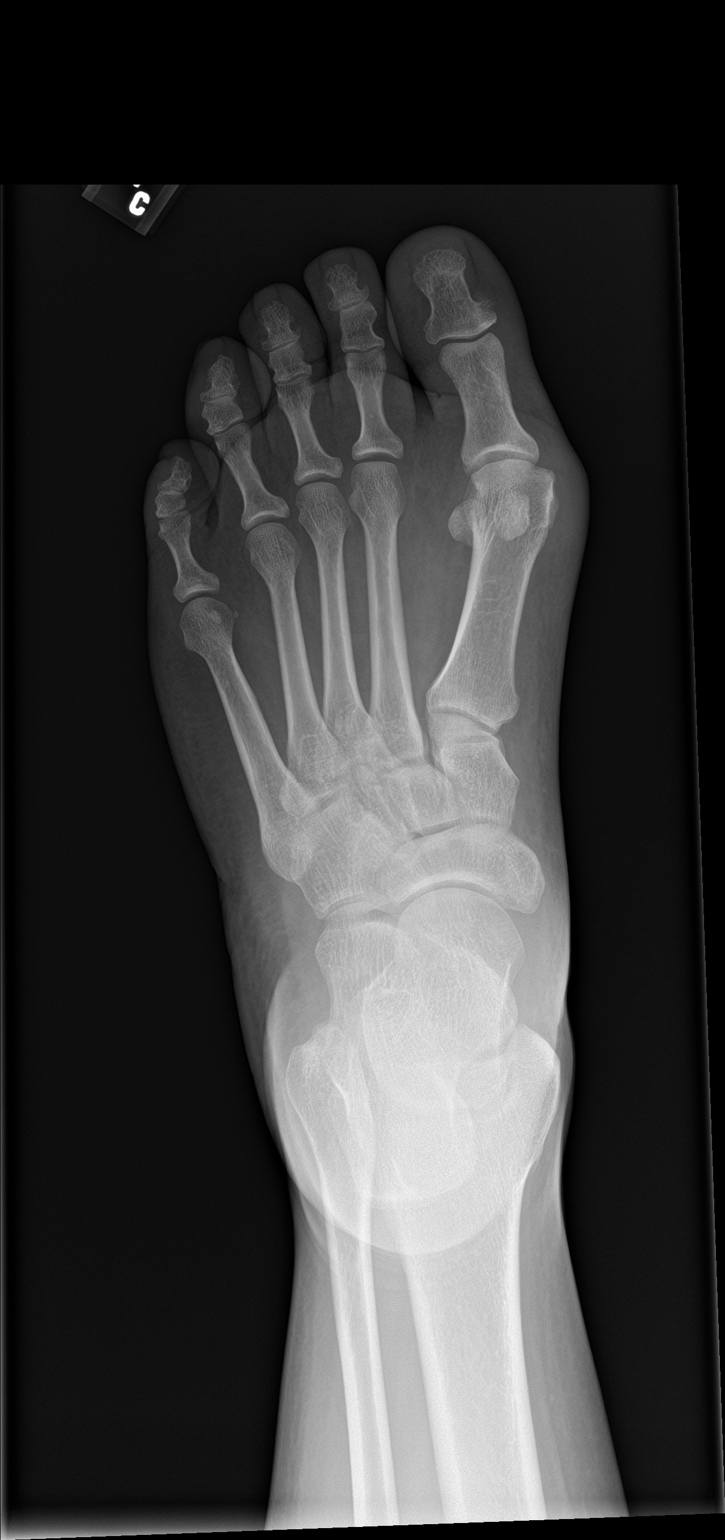

[foot obl]
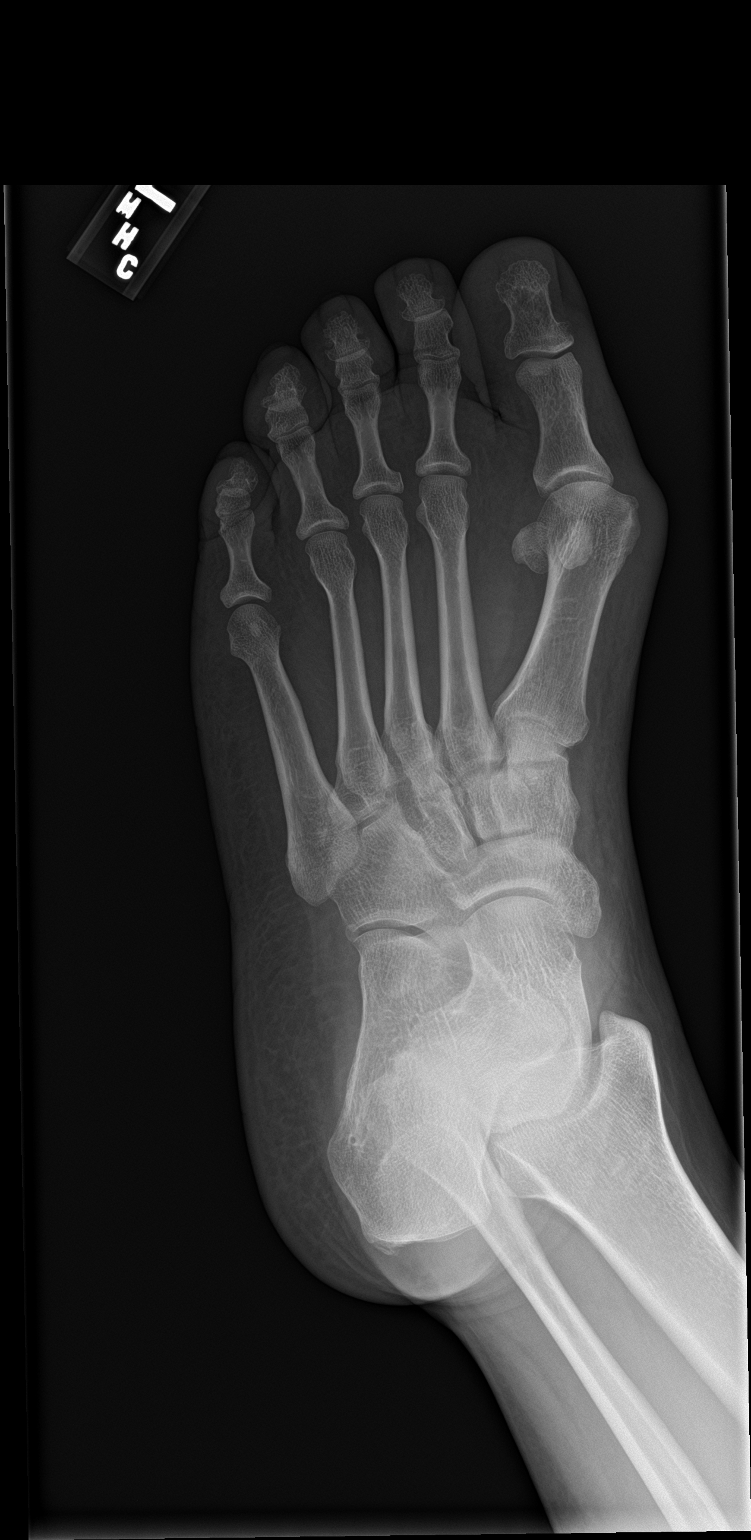

[foot lat]
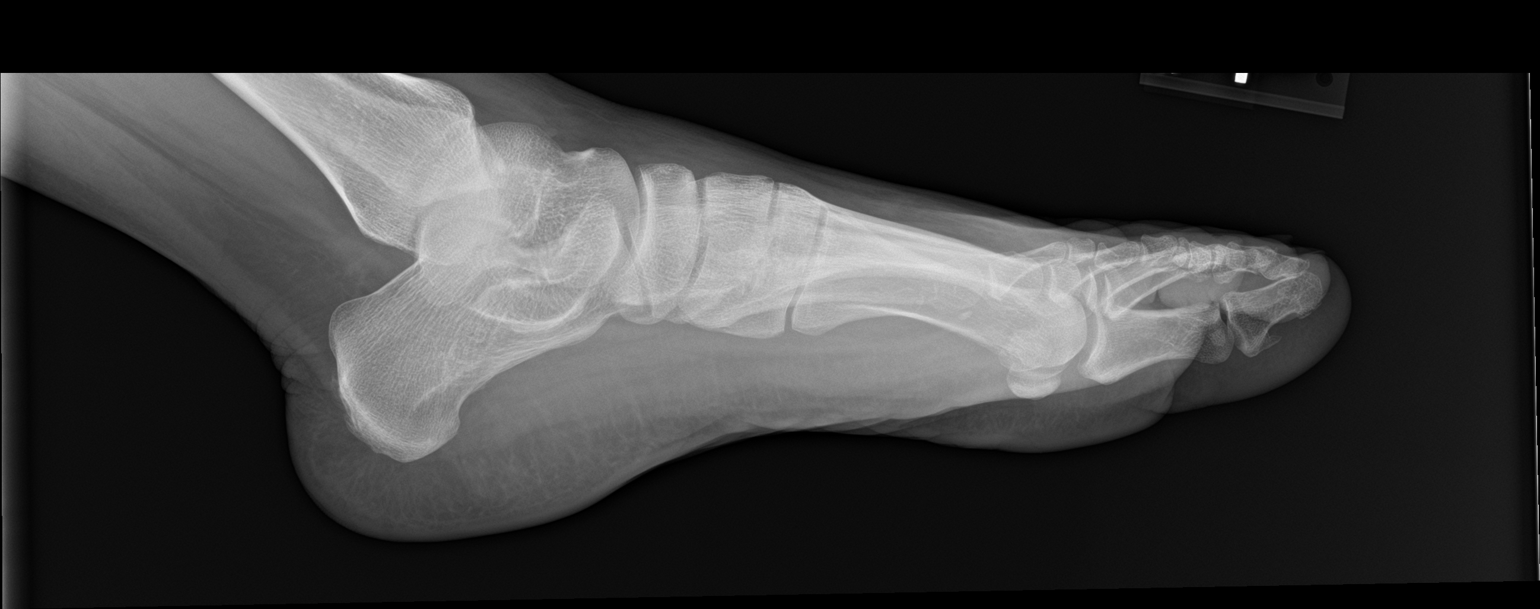

[3 of 3 positions shown; findings below may reference images not displayed]

FINDINGS: There is no evidence of fracture or dislocation. Mild hallux valgus
deformity of first metatarsophalangeal joint is noted. Soft tissues
are unremarkable.
IMPRESSION: Mild hallux valgus deformity of first metatarsophalangeal joint. No
acute abnormality is noted.

## 2023-04-28 DIAGNOSIS — F411 Generalized anxiety disorder: Secondary | ICD-10-CM | POA: Diagnosis not present

## 2023-05-14 DIAGNOSIS — F411 Generalized anxiety disorder: Secondary | ICD-10-CM | POA: Diagnosis not present

## 2023-05-22 DIAGNOSIS — F411 Generalized anxiety disorder: Secondary | ICD-10-CM | POA: Diagnosis not present

## 2023-06-11 DIAGNOSIS — F411 Generalized anxiety disorder: Secondary | ICD-10-CM | POA: Diagnosis not present

## 2023-06-30 DIAGNOSIS — F411 Generalized anxiety disorder: Secondary | ICD-10-CM | POA: Diagnosis not present

## 2023-07-14 ENCOUNTER — Other Ambulatory Visit (HOSPITAL_COMMUNITY): Payer: Self-pay

## 2023-07-14 DIAGNOSIS — F3342 Major depressive disorder, recurrent, in full remission: Secondary | ICD-10-CM | POA: Diagnosis not present

## 2023-07-14 DIAGNOSIS — E559 Vitamin D deficiency, unspecified: Secondary | ICD-10-CM | POA: Diagnosis not present

## 2023-07-14 DIAGNOSIS — E6609 Other obesity due to excess calories: Secondary | ICD-10-CM | POA: Diagnosis not present

## 2023-07-14 DIAGNOSIS — R030 Elevated blood-pressure reading, without diagnosis of hypertension: Secondary | ICD-10-CM | POA: Diagnosis not present

## 2023-07-14 DIAGNOSIS — M5136 Other intervertebral disc degeneration, lumbar region: Secondary | ICD-10-CM | POA: Diagnosis not present

## 2023-07-14 DIAGNOSIS — F419 Anxiety disorder, unspecified: Secondary | ICD-10-CM | POA: Diagnosis not present

## 2023-07-14 DIAGNOSIS — Z13220 Encounter for screening for lipoid disorders: Secondary | ICD-10-CM | POA: Diagnosis not present

## 2023-07-14 DIAGNOSIS — K219 Gastro-esophageal reflux disease without esophagitis: Secondary | ICD-10-CM | POA: Diagnosis not present

## 2023-07-14 DIAGNOSIS — G4733 Obstructive sleep apnea (adult) (pediatric): Secondary | ICD-10-CM | POA: Diagnosis not present

## 2023-07-14 DIAGNOSIS — H9193 Unspecified hearing loss, bilateral: Secondary | ICD-10-CM | POA: Diagnosis not present

## 2023-07-14 DIAGNOSIS — Z0000 Encounter for general adult medical examination without abnormal findings: Secondary | ICD-10-CM | POA: Diagnosis not present

## 2023-07-14 DIAGNOSIS — F411 Generalized anxiety disorder: Secondary | ICD-10-CM | POA: Diagnosis not present

## 2023-07-14 MED ORDER — BUSPIRONE HCL 15 MG PO TABS
15.0000 mg | ORAL_TABLET | Freq: Two times a day (BID) | ORAL | 3 refills | Status: DC
  Filled 2023-07-14: qty 180, 90d supply, fill #0
  Filled 2024-01-09: qty 180, 90d supply, fill #1
  Filled 2024-06-25: qty 180, 90d supply, fill #2

## 2023-07-14 MED ORDER — METHOCARBAMOL 750 MG PO TABS
750.0000 mg | ORAL_TABLET | Freq: Two times a day (BID) | ORAL | 2 refills | Status: DC | PRN
  Filled 2023-07-14: qty 60, 30d supply, fill #0
  Filled 2024-01-09: qty 60, 30d supply, fill #1
  Filled 2024-06-25: qty 60, 30d supply, fill #2

## 2023-07-14 MED ORDER — OMEPRAZOLE 40 MG PO CPDR
40.0000 mg | DELAYED_RELEASE_CAPSULE | Freq: Every day | ORAL | 2 refills | Status: DC
  Filled 2023-07-14: qty 30, 30d supply, fill #0
  Filled 2023-09-17 – 2023-10-23 (×2): qty 30, 30d supply, fill #1
  Filled 2024-01-09: qty 30, 30d supply, fill #2

## 2023-07-15 ENCOUNTER — Other Ambulatory Visit (HOSPITAL_COMMUNITY): Payer: Self-pay

## 2023-07-28 DIAGNOSIS — F411 Generalized anxiety disorder: Secondary | ICD-10-CM | POA: Diagnosis not present

## 2023-08-07 DIAGNOSIS — F411 Generalized anxiety disorder: Secondary | ICD-10-CM | POA: Diagnosis not present

## 2023-08-27 ENCOUNTER — Other Ambulatory Visit (HOSPITAL_COMMUNITY): Payer: Self-pay

## 2023-08-27 MED ORDER — VITAMIN D3 50 MCG (2000 UT) PO TABS
2000.0000 [IU] | ORAL_TABLET | Freq: Every day | ORAL | 3 refills | Status: DC
  Filled 2023-08-27: qty 90, 90d supply, fill #0

## 2023-08-27 MED ORDER — ERGOCALCIFEROL 1.25 MG (50000 UT) PO CAPS
50000.0000 [IU] | ORAL_CAPSULE | ORAL | 3 refills | Status: DC
  Filled 2023-08-27 – 2023-10-17 (×2): qty 13, 90d supply, fill #0
  Filled 2024-01-09: qty 13, 90d supply, fill #1
  Filled 2024-06-25: qty 13, 90d supply, fill #2

## 2023-08-28 ENCOUNTER — Other Ambulatory Visit (HOSPITAL_COMMUNITY): Payer: Self-pay

## 2023-09-08 ENCOUNTER — Other Ambulatory Visit (HOSPITAL_COMMUNITY): Payer: Self-pay

## 2023-09-08 DIAGNOSIS — F411 Generalized anxiety disorder: Secondary | ICD-10-CM | POA: Diagnosis not present

## 2023-09-11 DIAGNOSIS — H9041 Sensorineural hearing loss, unilateral, right ear, with unrestricted hearing on the contralateral side: Secondary | ICD-10-CM | POA: Diagnosis not present

## 2023-09-17 ENCOUNTER — Other Ambulatory Visit (HOSPITAL_COMMUNITY): Payer: Self-pay

## 2023-09-23 DIAGNOSIS — F411 Generalized anxiety disorder: Secondary | ICD-10-CM | POA: Diagnosis not present

## 2023-09-29 ENCOUNTER — Other Ambulatory Visit (HOSPITAL_COMMUNITY): Payer: Self-pay

## 2023-10-02 ENCOUNTER — Other Ambulatory Visit (HOSPITAL_COMMUNITY): Payer: Self-pay

## 2023-10-06 DIAGNOSIS — F411 Generalized anxiety disorder: Secondary | ICD-10-CM | POA: Diagnosis not present

## 2023-10-17 ENCOUNTER — Other Ambulatory Visit (HOSPITAL_COMMUNITY): Payer: Self-pay

## 2023-10-23 ENCOUNTER — Other Ambulatory Visit (HOSPITAL_COMMUNITY): Payer: Self-pay

## 2023-10-30 DIAGNOSIS — F411 Generalized anxiety disorder: Secondary | ICD-10-CM | POA: Diagnosis not present

## 2023-11-04 DIAGNOSIS — F411 Generalized anxiety disorder: Secondary | ICD-10-CM | POA: Diagnosis not present

## 2023-11-19 DIAGNOSIS — F411 Generalized anxiety disorder: Secondary | ICD-10-CM | POA: Diagnosis not present

## 2023-11-25 DIAGNOSIS — H524 Presbyopia: Secondary | ICD-10-CM | POA: Diagnosis not present

## 2023-11-26 DIAGNOSIS — F411 Generalized anxiety disorder: Secondary | ICD-10-CM | POA: Diagnosis not present

## 2023-12-03 DIAGNOSIS — F411 Generalized anxiety disorder: Secondary | ICD-10-CM | POA: Diagnosis not present

## 2023-12-23 DIAGNOSIS — F411 Generalized anxiety disorder: Secondary | ICD-10-CM | POA: Diagnosis not present

## 2024-01-07 DIAGNOSIS — F411 Generalized anxiety disorder: Secondary | ICD-10-CM | POA: Diagnosis not present

## 2024-01-09 ENCOUNTER — Other Ambulatory Visit (HOSPITAL_COMMUNITY): Payer: Self-pay

## 2024-01-12 ENCOUNTER — Other Ambulatory Visit (HOSPITAL_COMMUNITY): Payer: Self-pay

## 2024-01-17 ENCOUNTER — Other Ambulatory Visit (HOSPITAL_COMMUNITY): Payer: Self-pay

## 2024-02-02 DIAGNOSIS — F411 Generalized anxiety disorder: Secondary | ICD-10-CM | POA: Diagnosis not present

## 2024-02-12 DIAGNOSIS — F411 Generalized anxiety disorder: Secondary | ICD-10-CM | POA: Diagnosis not present

## 2024-03-01 ENCOUNTER — Telehealth (INDEPENDENT_AMBULATORY_CARE_PROVIDER_SITE_OTHER): Payer: Self-pay | Admitting: Otolaryngology

## 2024-03-01 NOTE — Telephone Encounter (Signed)
 LVM to confirm appt & location [id number] afm

## 2024-03-02 ENCOUNTER — Ambulatory Visit (INDEPENDENT_AMBULATORY_CARE_PROVIDER_SITE_OTHER): Payer: 59 | Admitting: Otolaryngology

## 2024-03-02 VITALS — BP 138/90 | HR 72 | Ht 65.0 in | Wt 175.0 lb

## 2024-03-02 DIAGNOSIS — H9041 Sensorineural hearing loss, unilateral, right ear, with unrestricted hearing on the contralateral side: Secondary | ICD-10-CM | POA: Diagnosis not present

## 2024-03-03 DIAGNOSIS — H9041 Sensorineural hearing loss, unilateral, right ear, with unrestricted hearing on the contralateral side: Secondary | ICD-10-CM | POA: Insufficient documentation

## 2024-03-03 NOTE — Progress Notes (Signed)
 Patient ID: Amber Arnold, female   DOB: 1976-05-26, 48 y.o.   MRN: [id number]  CC: Hearing difficulty  HPI:  Amber Arnold is a 48 y.o. female who presents today complaining of hearing difficulty for the past 5+ years.  She has significant difficulty understanding other people, especially in noisy environments.  Her hearing is worse on the right side.  She recently underwent a hearing test at [facility].  She was noted to have mild right high-frequency sensorineural hearing loss.  She denies any recent otitis media or otitis externa.  Currently she denies any otalgia, otorrhea, or vertigo.  She has no previous otologic surgery.  Past Medical History:  Diagnosis Date   Abnormal Pap smear     Past Surgical History:  Procedure Laterality Date   COLPOSCOPY      Family History  Problem Relation Age of Onset   Hydrocephalus Brother     Social History:  reports that she has never smoked. She has never used smokeless tobacco. She reports that she does not drink alcohol and does not use drugs.  Allergies:  Allergies  Allergen Reactions   Pineapple Anaphylaxis    Mouth feels like pins and needles    Prior to Admission medications   Medication Sig Start Date End Date Taking? Authorizing Provider  acetaminophen (TYLENOL) 325 MG tablet Take 650 mg by mouth every 6 (six) hours as needed. For back pain.   Yes [provider]  busPIRone (BUSPAR) 15 MG tablet Take 1 tablet (15 mg total) by mouth 2 (two) times daily. 07/14/23  Yes   ergocalciferol (VITAMIN D2) 1.25 MG (50000 UT) capsule Take 1 capsule (50,000 Units total) by mouth once a week. 08/27/23  Yes   methocarbamol (ROBAXIN) 750 MG tablet Take 1 tablet (750 mg total) by mouth every 12 (twelve) hours as needed. 07/14/23  Yes   naproxen (NAPROSYN) 500 MG tablet Take 1 tablet (500 mg total) by mouth 2 (two) times daily. 08/12/21  Yes Raspet, Noberto Retort, PA-C  omeprazole (PRILOSEC) 40 MG capsule Take 1 capsule (40 mg total) by  mouth daily 30 minutes before morning meal 07/14/23  Yes   NIFEdipine (PROCARDIA-XL/ADALAT CC) 30 MG 24 hr tablet Take 1 tablet (30 mg total) by mouth daily. 08/03/12 08/03/13  Gerald Leitz, MD  Prenatal Vit-Fe Fumarate-FA (PRENATAL MULTIVITAMIN) TABS Take 1 tablet by mouth daily.    [provider]    Blood pressure (!) 138/90, pulse 72, height 5\' 5"  (1.651 m), weight 175 lb (79.4 kg), SpO2 97%, unknown if currently breastfeeding. Exam: General: Communicates without difficulty, well nourished, no acute distress. Head: Normocephalic, no evidence injury, no tenderness, facial buttresses intact without stepoff. Face/sinus: No tenderness to palpation and percussion. Facial movement is normal and symmetric. Eyes: PERRL, EOMI. No scleral icterus, conjunctivae clear. Neuro: CN II exam reveals vision grossly intact.  No nystagmus at any point of gaze. Ears: Auricles well formed without lesions.  Ear canals are intact without mass or lesion.  No erythema or edema is appreciated.  The TMs are intact without fluid. Nose: External evaluation reveals normal support and skin without lesions.  Dorsum is intact.  Anterior rhinoscopy reveals normal mucosa over anterior aspect of inferior turbinates and intact septum.  No purulence noted. Oral:  Oral cavity and oropharynx are intact, symmetric, without erythema or edema.  Mucosa is moist without lesions. Neck: Full range of motion without pain.  There is no significant lymphadenopathy.  No masses palpable.  Thyroid bed within  normal limits to palpation.  Parotid glands and submandibular glands equal bilaterally without mass.  Trachea is midline. Neuro:  CN 2-12 grossly intact.   Assessment: 1.  The patient's ear canals, tympanic membranes, and middle ear spaces are normal.  No middle ear effusion or infection is noted. 2.  Her recent hearing test at [facility] shows minimal right ear high-frequency sensorineural hearing loss.  Plan: 1.  The physical  exam findings are reviewed with the patient.  The patient is reassured that no anatomic abnormality is noted today. 2.  The patient is a candidate for hearing amplification.  She is interested in obtaining her hearing aids from [employer]. 3.  The patient is encouraged to call with any questions or concerns.  Amber Arnold 03/03/2024, 10:54 AM

## 2024-03-23 DIAGNOSIS — F411 Generalized anxiety disorder: Secondary | ICD-10-CM | POA: Diagnosis not present

## 2024-04-08 DIAGNOSIS — F411 Generalized anxiety disorder: Secondary | ICD-10-CM | POA: Diagnosis not present

## 2024-04-20 ENCOUNTER — Other Ambulatory Visit (HOSPITAL_COMMUNITY): Payer: Self-pay

## 2024-04-20 DIAGNOSIS — M5459 Other low back pain: Secondary | ICD-10-CM | POA: Diagnosis not present

## 2024-04-20 DIAGNOSIS — G4733 Obstructive sleep apnea (adult) (pediatric): Secondary | ICD-10-CM | POA: Diagnosis not present

## 2024-04-20 DIAGNOSIS — G4726 Circadian rhythm sleep disorder, shift work type: Secondary | ICD-10-CM | POA: Diagnosis not present

## 2024-04-20 DIAGNOSIS — Z7189 Other specified counseling: Secondary | ICD-10-CM | POA: Diagnosis not present

## 2024-04-20 MED ORDER — TRAZODONE HCL 50 MG PO TABS
50.0000 mg | ORAL_TABLET | Freq: Every day | ORAL | 2 refills | Status: AC
  Filled 2024-04-20: qty 30, 30d supply, fill #0
  Filled 2024-09-16: qty 30, 30d supply, fill #1
  Filled 2024-10-20: qty 30, 30d supply, fill #2

## 2024-04-20 MED ORDER — GABAPENTIN 300 MG PO CAPS
300.0000 mg | ORAL_CAPSULE | Freq: Every evening | ORAL | 2 refills | Status: AC | PRN
  Filled 2024-04-20: qty 30, 30d supply, fill #0
  Filled 2024-06-25: qty 30, 30d supply, fill #1

## 2024-04-22 DIAGNOSIS — F411 Generalized anxiety disorder: Secondary | ICD-10-CM | POA: Diagnosis not present

## 2024-05-13 DIAGNOSIS — F411 Generalized anxiety disorder: Secondary | ICD-10-CM | POA: Diagnosis not present

## 2024-05-25 DIAGNOSIS — F411 Generalized anxiety disorder: Secondary | ICD-10-CM | POA: Diagnosis not present

## 2024-06-02 DIAGNOSIS — F411 Generalized anxiety disorder: Secondary | ICD-10-CM | POA: Diagnosis not present

## 2024-06-09 DIAGNOSIS — F411 Generalized anxiety disorder: Secondary | ICD-10-CM | POA: Diagnosis not present

## 2024-06-16 DIAGNOSIS — F411 Generalized anxiety disorder: Secondary | ICD-10-CM | POA: Diagnosis not present

## 2024-06-17 ENCOUNTER — Telehealth (INDEPENDENT_AMBULATORY_CARE_PROVIDER_SITE_OTHER): Admitting: Adult Health

## 2024-06-17 DIAGNOSIS — Z0389 Encounter for observation for other suspected diseases and conditions ruled out: Secondary | ICD-10-CM

## 2024-06-17 NOTE — Progress Notes (Signed)
 No charge.

## 2024-06-25 ENCOUNTER — Other Ambulatory Visit: Payer: Self-pay

## 2024-06-25 ENCOUNTER — Other Ambulatory Visit (HOSPITAL_COMMUNITY): Payer: Self-pay

## 2024-06-25 MED ORDER — OMEPRAZOLE 40 MG PO CPDR
40.0000 mg | DELAYED_RELEASE_CAPSULE | Freq: Every morning | ORAL | 2 refills | Status: AC
  Filled 2024-06-25: qty 30, 30d supply, fill #0
  Filled 2024-10-20: qty 30, 30d supply, fill #1

## 2024-07-07 DIAGNOSIS — F411 Generalized anxiety disorder: Secondary | ICD-10-CM | POA: Diagnosis not present

## 2024-07-16 DIAGNOSIS — Z1211 Encounter for screening for malignant neoplasm of colon: Secondary | ICD-10-CM | POA: Diagnosis not present

## 2024-07-16 DIAGNOSIS — Z1231 Encounter for screening mammogram for malignant neoplasm of breast: Secondary | ICD-10-CM | POA: Diagnosis not present

## 2024-07-16 DIAGNOSIS — Z0000 Encounter for general adult medical examination without abnormal findings: Secondary | ICD-10-CM | POA: Diagnosis not present

## 2024-07-16 DIAGNOSIS — M5136 Other intervertebral disc degeneration, lumbar region with discogenic back pain only: Secondary | ICD-10-CM | POA: Diagnosis not present

## 2024-07-16 DIAGNOSIS — N951 Menopausal and female climacteric states: Secondary | ICD-10-CM | POA: Diagnosis not present

## 2024-07-16 DIAGNOSIS — E559 Vitamin D deficiency, unspecified: Secondary | ICD-10-CM | POA: Diagnosis not present

## 2024-07-16 DIAGNOSIS — F419 Anxiety disorder, unspecified: Secondary | ICD-10-CM | POA: Diagnosis not present

## 2024-07-21 DIAGNOSIS — F411 Generalized anxiety disorder: Secondary | ICD-10-CM | POA: Diagnosis not present

## 2024-07-26 ENCOUNTER — Other Ambulatory Visit: Payer: Self-pay | Admitting: Obstetrics and Gynecology

## 2024-07-26 DIAGNOSIS — Z1231 Encounter for screening mammogram for malignant neoplasm of breast: Secondary | ICD-10-CM

## 2024-07-27 ENCOUNTER — Ambulatory Visit
Admission: RE | Admit: 2024-07-27 | Discharge: 2024-07-27 | Disposition: A | Discharge status: Home / Self Care | Source: Ambulatory Visit | Attending: Obstetrics and Gynecology | Admitting: Obstetrics and Gynecology

## 2024-07-27 DIAGNOSIS — Z1231 Encounter for screening mammogram for malignant neoplasm of breast: Secondary | ICD-10-CM

## 2024-07-30 DIAGNOSIS — Z124 Encounter for screening for malignant neoplasm of cervix: Secondary | ICD-10-CM | POA: Diagnosis not present

## 2024-07-30 DIAGNOSIS — Z01419 Encounter for gynecological examination (general) (routine) without abnormal findings: Secondary | ICD-10-CM | POA: Diagnosis not present

## 2024-07-30 DIAGNOSIS — Z30431 Encounter for routine checking of intrauterine contraceptive device: Secondary | ICD-10-CM | POA: Diagnosis not present

## 2024-07-30 DIAGNOSIS — Z1151 Encounter for screening for human papillomavirus (HPV): Secondary | ICD-10-CM | POA: Diagnosis not present

## 2024-08-19 DIAGNOSIS — F411 Generalized anxiety disorder: Secondary | ICD-10-CM | POA: Diagnosis not present

## 2024-08-31 DIAGNOSIS — F411 Generalized anxiety disorder: Secondary | ICD-10-CM | POA: Diagnosis not present

## 2024-09-08 ENCOUNTER — Other Ambulatory Visit (HOSPITAL_COMMUNITY): Payer: Self-pay

## 2024-09-08 DIAGNOSIS — F411 Generalized anxiety disorder: Secondary | ICD-10-CM | POA: Diagnosis not present

## 2024-09-08 DIAGNOSIS — M25552 Pain in left hip: Secondary | ICD-10-CM | POA: Diagnosis not present

## 2024-09-08 DIAGNOSIS — M47816 Spondylosis without myelopathy or radiculopathy, lumbar region: Secondary | ICD-10-CM | POA: Diagnosis not present

## 2024-09-08 MED ORDER — PREDNISONE 5 MG (21) PO TBPK
ORAL_TABLET | ORAL | 1 refills | Status: AC
  Filled 2024-09-08: qty 21, 6d supply, fill #0

## 2024-09-14 DIAGNOSIS — F411 Generalized anxiety disorder: Secondary | ICD-10-CM | POA: Diagnosis not present

## 2024-09-16 ENCOUNTER — Other Ambulatory Visit (HOSPITAL_COMMUNITY): Payer: Self-pay

## 2024-09-23 DIAGNOSIS — M47816 Spondylosis without myelopathy or radiculopathy, lumbar region: Secondary | ICD-10-CM | POA: Diagnosis not present

## 2024-09-23 DIAGNOSIS — F411 Generalized anxiety disorder: Secondary | ICD-10-CM | POA: Diagnosis not present

## 2024-09-24 ENCOUNTER — Other Ambulatory Visit (HOSPITAL_COMMUNITY): Payer: Self-pay

## 2024-09-24 MED ORDER — BISACODYL 5 MG PO TBEC
DELAYED_RELEASE_TABLET | ORAL | 0 refills | Status: AC
  Filled 2024-09-24: qty 4, 1d supply, fill #0

## 2024-09-24 MED ORDER — PEG 3350-KCL-NA BICARB-NACL 420 G PO SOLR
4000.0000 mL | ORAL | 0 refills | Status: AC
  Filled 2024-09-24: qty 4000, 1d supply, fill #0

## 2024-09-28 DIAGNOSIS — Z1211 Encounter for screening for malignant neoplasm of colon: Secondary | ICD-10-CM | POA: Diagnosis not present

## 2024-09-28 DIAGNOSIS — K649 Unspecified hemorrhoids: Secondary | ICD-10-CM | POA: Diagnosis not present

## 2024-09-29 DIAGNOSIS — F411 Generalized anxiety disorder: Secondary | ICD-10-CM | POA: Diagnosis not present

## 2024-10-06 DIAGNOSIS — F411 Generalized anxiety disorder: Secondary | ICD-10-CM | POA: Diagnosis not present

## 2024-10-08 DIAGNOSIS — F411 Generalized anxiety disorder: Secondary | ICD-10-CM | POA: Diagnosis not present

## 2024-10-12 DIAGNOSIS — F411 Generalized anxiety disorder: Secondary | ICD-10-CM | POA: Diagnosis not present

## 2024-10-20 ENCOUNTER — Other Ambulatory Visit (HOSPITAL_COMMUNITY): Payer: Self-pay

## 2024-10-20 ENCOUNTER — Other Ambulatory Visit: Payer: Self-pay

## 2024-10-20 MED ORDER — VITAMIN D (ERGOCALCIFEROL) 1.25 MG (50000 UNIT) PO CAPS
50000.0000 [IU] | ORAL_CAPSULE | ORAL | 3 refills | Status: AC
  Filled 2024-10-20: qty 13, 90d supply, fill #0

## 2024-10-20 MED ORDER — METHOCARBAMOL 750 MG PO TABS
ORAL_TABLET | ORAL | 2 refills | Status: AC
  Filled 2024-10-20: qty 60, 30d supply, fill #0

## 2024-10-21 ENCOUNTER — Other Ambulatory Visit (HOSPITAL_COMMUNITY): Payer: Self-pay

## 2024-10-21 MED ORDER — BUSPIRONE HCL 10 MG PO TABS
10.0000 mg | ORAL_TABLET | Freq: Two times a day (BID) | ORAL | 3 refills | Status: AC
  Filled 2024-10-21: qty 180, 90d supply, fill #0

## 2024-10-26 ENCOUNTER — Other Ambulatory Visit (HOSPITAL_COMMUNITY): Payer: Self-pay

## 2024-10-26 DIAGNOSIS — F411 Generalized anxiety disorder: Secondary | ICD-10-CM | POA: Diagnosis not present

## 2024-11-25 DIAGNOSIS — F411 Generalized anxiety disorder: Secondary | ICD-10-CM | POA: Diagnosis not present

## 2024-11-29 DIAGNOSIS — F411 Generalized anxiety disorder: Secondary | ICD-10-CM | POA: Diagnosis not present

## 2024-12-03 ENCOUNTER — Other Ambulatory Visit (HOSPITAL_COMMUNITY): Payer: Self-pay | Admitting: Nurse Practitioner

## 2024-12-03 ENCOUNTER — Ambulatory Visit (HOSPITAL_COMMUNITY)
Admission: RE | Admit: 2024-12-03 | Discharge: 2024-12-03 | Disposition: A | Discharge status: Home / Self Care | Payer: Self-pay | Source: Ambulatory Visit | Attending: Nurse Practitioner | Admitting: Nurse Practitioner

## 2024-12-03 DIAGNOSIS — S60131A Contusion of right middle finger with damage to nail, initial encounter: Secondary | ICD-10-CM | POA: Insufficient documentation

## 2024-12-03 DIAGNOSIS — S6990XA Unspecified injury of unspecified wrist, hand and finger(s), initial encounter: Secondary | ICD-10-CM | POA: Diagnosis not present

## 2024-12-03 DIAGNOSIS — S67192A Crushing injury of right middle finger, initial encounter: Secondary | ICD-10-CM | POA: Diagnosis present

## 2024-12-06 DIAGNOSIS — F411 Generalized anxiety disorder: Secondary | ICD-10-CM | POA: Diagnosis not present
